# Patient Record
Sex: Female | Born: 2000 | Hispanic: No | Marital: Single | Smoking: Never smoker
Health system: Southern US, Community
[De-identification: ages and names within clinical notes are randomized; demographics above are authoritative.]

---
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
## 2020-02-16 DIAGNOSIS — R5383 Other fatigue: Secondary | ICD-10-CM | POA: Diagnosis not present

## 2020-02-16 DIAGNOSIS — R0602 Shortness of breath: Secondary | ICD-10-CM | POA: Diagnosis not present

## 2020-02-16 DIAGNOSIS — Z13220 Encounter for screening for lipoid disorders: Secondary | ICD-10-CM | POA: Diagnosis not present

## 2020-02-16 DIAGNOSIS — Z1159 Encounter for screening for other viral diseases: Secondary | ICD-10-CM | POA: Diagnosis not present

## 2020-02-16 DIAGNOSIS — Z131 Encounter for screening for diabetes mellitus: Secondary | ICD-10-CM | POA: Diagnosis not present

## 2020-02-16 DIAGNOSIS — E559 Vitamin D deficiency, unspecified: Secondary | ICD-10-CM | POA: Diagnosis not present

## 2020-02-16 DIAGNOSIS — Z114 Encounter for screening for human immunodeficiency virus [HIV]: Secondary | ICD-10-CM | POA: Diagnosis not present

## 2020-02-16 DIAGNOSIS — Z0000 Encounter for general adult medical examination without abnormal findings: Secondary | ICD-10-CM | POA: Diagnosis not present

## 2020-02-24 DIAGNOSIS — Z9229 Personal history of other drug therapy: Secondary | ICD-10-CM | POA: Diagnosis not present

## 2020-02-24 DIAGNOSIS — E559 Vitamin D deficiency, unspecified: Secondary | ICD-10-CM | POA: Diagnosis not present

## 2020-03-02 DIAGNOSIS — Z23 Encounter for immunization: Secondary | ICD-10-CM | POA: Diagnosis not present

## 2020-04-28 ENCOUNTER — Ambulatory Visit (HOSPITAL_COMMUNITY)
Admission: EM | Admit: 2020-04-28 | Discharge: 2020-04-28 | Disposition: A | Discharge status: Home / Self Care | Payer: PRIVATE HEALTH INSURANCE | Attending: Family Medicine | Admitting: Family Medicine

## 2020-04-28 ENCOUNTER — Other Ambulatory Visit: Payer: Self-pay

## 2020-04-28 ENCOUNTER — Encounter (HOSPITAL_COMMUNITY): Payer: Self-pay

## 2020-04-28 DIAGNOSIS — J069 Acute upper respiratory infection, unspecified: Secondary | ICD-10-CM | POA: Insufficient documentation

## 2020-04-28 DIAGNOSIS — Z20822 Contact with and (suspected) exposure to covid-19: Secondary | ICD-10-CM | POA: Insufficient documentation

## 2020-04-28 LAB — SARS CORONAVIRUS 2 (TAT 6-24 HRS): SARS Coronavirus 2: NEGATIVE

## 2020-04-28 MED ORDER — PSEUDOEPH-BROMPHEN-DM 30-2-10 MG/5ML PO SYRP: 5.0000 mL | ORAL_SOLUTION | Freq: Four times a day (QID) | ORAL | 0 refills | Status: DC | PRN

## 2020-04-28 NOTE — Discharge Instructions (Addendum)
You have been tested for COVID-19 today. If your test returns positive, you will receive a phone call from [facility] regarding your results. Negative test results are not called. Both positive and negative results area always visible on MyChart. If you do not have a MyChart account, sign up instructions are provided in your discharge papers. Please do not hesitate to contact us should you have questions or concerns.  You may use over the counter ibuprofen or acetaminophen as needed.  For a sore throat, over the counter products such as [employer] Peroxyl Mouth Sore Rinse or Chloraseptic Sore Throat Spray may provide some temporary relief.   

## 2020-04-28 NOTE — ED Triage Notes (Signed)
Pt c/o sore throat, congestion, non-productive coughbody aches onset Tuesday, bilateral ear pressure onset yesterday. Nausea onset today upon waking, now resolved.  Denies v/d, fever,SOB, chills, loss of taste/smell. Last took ibuprofen yesterday.

## 2020-04-28 NOTE — ED Provider Notes (Signed)
[facility]   [id number] 04/28/20 Arrival Time: 1210  ASSESSMENT & PLAN:  1. Viral URI with cough     COVID-19 testing sent. See letter/work note on file for self-isolation guidelines. OTC symptom care as needed.   Meds ordered this encounter  Medications  . brompheniramine-pseudoephedrine-DM 30-2-10 MG/5ML syrup    Sig: Take 5 mLs by mouth 4 (four) times daily as needed.    Dispense:  120 mL    Refill:  0     Follow-up Information    Knox Royalty, MD.   Specialty: Family Medicine Why: As needed. Contact information: [address] [place] [address] [phone]               Reviewed expectations re: course of current medical issues. Questions answered. Outlined signs and symptoms indicating need for more acute intervention. Understanding verbalized. After Visit Summary given.   SUBJECTIVE: History from: patient. Diana Mcclure is a 19 y.o. female who presents with worries regarding COVID-19. Known COVID-19 contact: none. Recent travel: none. Reports: ST, non-prod cough, body aches, otalgia for the past few days. Denies: fever and difficulty breathing. Normal PO intake without n/v/d.   OBJECTIVE:  Vitals:   04/28/20 1403  BP: 111/74  Pulse: 79  Resp: 18  Temp: 98.1 F (36.7 C)  TempSrc: Oral  SpO2: 100%    General appearance: alert; no distress Eyes: PERRLA; EOMI; conjunctiva normal HENT: [place]; AT; with nasal congestion; throat with cobblestoning Neck: supple  Lungs: speaks full sentences without difficulty; unlabored Extremities: no edema Skin: warm and dry Neurologic: normal gait Psychological: alert and cooperative; normal mood and affect  Labs:  Labs Reviewed  SARS CORONAVIRUS 2 (TAT 6-24 HRS)     No Known Allergies  History reviewed. No pertinent past medical history. Social History   Socioeconomic History  . Marital status: Single    Spouse name: Not on file  . Number of children: Not on file  . Years of  education: Not on file  . Highest education level: Not on file  Occupational History  . Not on file  Tobacco Use  . Smoking status: Never Smoker  . Smokeless tobacco: Never Used  Vaping Use  . Vaping Use: Never used  Substance and Sexual Activity  . Alcohol use: Never    Alcohol/week: 0.0 standard drinks  . Drug use: Never  . Sexual activity: Never  Other Topics Concern  . Not on file  Social History Narrative  . Not on file   Social Determinants of Health   Financial Resource Strain:   . Difficulty of Paying Living Expenses: Not on file  Food Insecurity:   . Worried About Programme researcher, broadcasting/film/video in the Last Year: Not on file  . Ran Out of Food in the Last Year: Not on file  Transportation Needs:   . Lack of Transportation (Medical): Not on file  . Lack of Transportation (Non-Medical): Not on file  Physical Activity:   . Days of Exercise per Week: Not on file  . Minutes of Exercise per Session: Not on file  Stress:   . Feeling of Stress : Not on file  Social Connections:   . Frequency of Communication with Friends and Family: Not on file  . Frequency of Social Gatherings with Friends and Family: Not on file  . Attends Religious Services: Not on file  . Active Member of Clubs or Organizations: Not on file  . Attends Banker Meetings: Not on file  . Marital  Status: Not on file  Intimate Partner Violence:   . Fear of Current or Ex-Partner: Not on file  . Emotionally Abused: Not on file  . Physically Abused: Not on file  . Sexually Abused: Not on file   Family History  Problem Relation Age of Onset  . Diabetes Father    History reviewed. No pertinent surgical history.   Mardella Layman, MD 04/28/20 [phone]

## 2020-05-07 ENCOUNTER — Encounter (HOSPITAL_COMMUNITY): Payer: Self-pay

## 2020-05-07 ENCOUNTER — Other Ambulatory Visit: Payer: Self-pay

## 2020-05-07 ENCOUNTER — Ambulatory Visit (HOSPITAL_COMMUNITY)
Admission: EM | Admit: 2020-05-07 | Discharge: 2020-05-07 | Disposition: A | Discharge status: Home / Self Care | Payer: PRIVATE HEALTH INSURANCE | Attending: Family Medicine | Admitting: Family Medicine

## 2020-05-07 DIAGNOSIS — Z79899 Other long term (current) drug therapy: Secondary | ICD-10-CM | POA: Insufficient documentation

## 2020-05-07 DIAGNOSIS — R5383 Other fatigue: Secondary | ICD-10-CM | POA: Insufficient documentation

## 2020-05-07 DIAGNOSIS — R062 Wheezing: Secondary | ICD-10-CM | POA: Diagnosis not present

## 2020-05-07 DIAGNOSIS — Z7952 Long term (current) use of systemic steroids: Secondary | ICD-10-CM | POA: Diagnosis not present

## 2020-05-07 DIAGNOSIS — J40 Bronchitis, not specified as acute or chronic: Secondary | ICD-10-CM | POA: Insufficient documentation

## 2020-05-07 DIAGNOSIS — Z20822 Contact with and (suspected) exposure to covid-19: Secondary | ICD-10-CM | POA: Diagnosis not present

## 2020-05-07 LAB — SARS CORONAVIRUS 2 (TAT 6-24 HRS): SARS Coronavirus 2: NEGATIVE

## 2020-05-07 MED ORDER — ALBUTEROL SULFATE HFA 108 (90 BASE) MCG/ACT IN AERS: 1.0000 | INHALATION_SPRAY | Freq: Four times a day (QID) | RESPIRATORY_TRACT | 0 refills | Status: DC | PRN

## 2020-05-07 MED ORDER — PREDNISONE 20 MG PO TABS: 40.0000 mg | ORAL_TABLET | Freq: Every day | ORAL | 0 refills | Status: DC

## 2020-05-07 NOTE — ED Provider Notes (Signed)
[facility]    CSN: [id number] Arrival date & time: 05/07/20  1007      History   Chief Complaint Chief Complaint  Patient presents with  . Cough    HPI Diana Mcclure is a 19 y.o. female.   Here today for re-evaluation of worsening URI sxs. Was tested last week right after onset of sxs for COVID and was negative, has been taking sudafed and dayquil and nyquil without much relief. Fatigue, chest tightness and occasional wheezing, hacking dry cough, sinus and ear pressure. Denies fever, chills, CP, N/V/D, rashes, new sick contacts. No known hx of asthma or allergies.       History reviewed. No pertinent past medical history.  There are no problems to display for this patient.   History reviewed. No pertinent surgical history.  OB History   No obstetric history on file.      Home Medications    Prior to Admission medications   Medication Sig Start Date End Date Taking? Authorizing Provider  brompheniramine-pseudoephedrine-DM 30-2-10 MG/5ML syrup Take 5 mLs by mouth 4 (four) times daily as needed. 04/28/20  Yes Hagler, Arlys John, MD  albuterol (VENTOLIN HFA) 108 (90 Base) MCG/ACT inhaler Inhale 1-2 puffs into the lungs every 6 (six) hours as needed for wheezing or shortness of breath. 05/07/20   Particia Nearing, PA-C  mupirocin ointment (BACTROBAN) 2 % Apply 1 application topically 2 (two) times daily. 09/14/18   Mardella Layman, MD  predniSONE (DELTASONE) 20 MG tablet Take 2 tablets (40 mg total) by mouth daily with breakfast. 05/07/20   Particia Nearing, PA-C  Vitamin D, Ergocalciferol, (DRISDOL) 1.25 MG (50000 UNIT) CAPS capsule Take 50,000 Units by mouth once a week. 02/24/20   [provider]    Family History Family History  Problem Relation Age of Onset  . Diabetes Father     Social History Social History   Tobacco Use  . Smoking status: Never Smoker  . Smokeless tobacco: Never Used  Vaping Use  . Vaping Use: Never used    Substance Use Topics  . Alcohol use: Never    Alcohol/week: 0.0 standard drinks  . Drug use: Never     Allergies   Patient has no known allergies.   Review of Systems Review of Systems PER HPI   Physical Exam Triage Vital Signs ED Triage Vitals  Enc Vitals Group     BP 05/07/20 1049 115/76     Pulse Rate 05/07/20 1049 93     Resp 05/07/20 1049 15     Temp 05/07/20 1049 98.7 F (37.1 C)     Temp Source 05/07/20 1049 Oral     SpO2 05/07/20 1049 100 %     Weight --      Height --      Head Circumference --      Peak Flow --      Pain Score 05/07/20 1045 5     Pain Loc --      Pain Edu? --      Excl. in GC? --    No data found.  Updated Vital Signs BP 115/76 (BP Location: Right Arm)   Pulse 93   Temp 98.7 F (37.1 C) (Oral)   Resp 15   LMP 04/07/2020   SpO2 100%   Visual Acuity Right Eye Distance:   Left Eye Distance:   Bilateral Distance:    Right Eye Near:   Left Eye Near:    Bilateral Near:  Physical Exam Vitals and nursing note reviewed.  Constitutional:      Appearance: Normal appearance. She is not ill-appearing.  HENT:     Head: Atraumatic.     Right Ear: Tympanic membrane normal.     Left Ear: Tympanic membrane normal.     Nose: Rhinorrhea present.     Mouth/Throat:     Mouth: Mucous membranes are moist.     Pharynx: Posterior oropharyngeal erythema present.  Eyes:     Extraocular Movements: Extraocular movements intact.     Conjunctiva/sclera: Conjunctivae normal.  Cardiovascular:     Rate and Rhythm: Normal rate and regular rhythm.     Heart sounds: Normal heart sounds.  Pulmonary:     Effort: Pulmonary effort is normal.     Breath sounds: Normal breath sounds. No wheezing or rales.  Abdominal:     General: Bowel sounds are normal. There is no distension.     Palpations: Abdomen is soft.     Tenderness: There is no abdominal tenderness. There is no guarding.  Musculoskeletal:        General: Normal range of motion.      Cervical back: Normal range of motion and neck supple.  Skin:    General: Skin is warm and dry.  Neurological:     Mental Status: She is alert and oriented to person, place, and time.  Psychiatric:        Mood and Affect: Mood normal.        Thought Content: Thought content normal.        Judgment: Judgment normal.    UC Treatments / Results  Labs (all labs ordered are listed, but only abnormal results are displayed) Labs Reviewed  SARS CORONAVIRUS 2 (TAT 6-24 HRS)    EKG   Radiology No results found.  Procedures Procedures (including critical care time)  Medications Ordered in UC Medications - No data to display  Initial Impression / Assessment and Plan / UC Course  I have reviewed the triage vital signs and the nursing notes.  Pertinent labs & imaging results that were available during my care of the patient were reviewed by me and considered in my medical decision making (see chart for details).     Retest for COVID pending, isolation protocol reviewed and work note given. Vitals and exam reassuring today, she is well appearing overall. Will treat today with prednisone, albuterol, and continued dayquil and nyquil prn. Return precautions reviewed for worsening sxs.   Final Clinical Impressions(s) / UC Diagnoses   Final diagnoses:  Bronchitis   Discharge Instructions   None    ED Prescriptions    Medication Sig Dispense Auth. Provider   predniSONE (DELTASONE) 20 MG tablet Take 2 tablets (40 mg total) by mouth daily with breakfast. 10 tablet Particia Nearing, PA-C   albuterol (VENTOLIN HFA) 108 (90 Base) MCG/ACT inhaler Inhale 1-2 puffs into the lungs every 6 (six) hours as needed for wheezing or shortness of breath. 18 g Particia Nearing, [place]     PDMP not reviewed this encounter.   Particia Nearing, [place] 05/07/20 1115

## 2020-05-07 NOTE — ED Triage Notes (Signed)
Pt reports she was evaluated here last week for sore throat and congestion and had negative COVID testing.  Today reports non-productive cough, worsening congestion, pressure behind ears, body aches, increased fatigue for approx 5 days. Reports sore throat improved.  Employer ([employer]) sent pt for eval and re-testing of COVID.   Denies SOB, fever, n/v/d.  Has been taking nyquil, dayquil for symptoms, last took OTC yesterday w/o improvement to symptoms.

## 2020-08-03 ENCOUNTER — Ambulatory Visit: Payer: Medicaid Other | Attending: Internal Medicine

## 2020-08-03 DIAGNOSIS — Z23 Encounter for immunization: Secondary | ICD-10-CM

## 2020-08-03 NOTE — Progress Notes (Signed)
   Covid-19 Vaccination Clinic  Name:  Diana Mcclure    MRN: [id number] DOB: 05-Dec-2000  08/03/2020  Diana Mcclure was observed post Covid-19 immunization for 15 minutes without incident. She was provided with Vaccine Information Sheet and instruction to access the V-Safe system.   Diana Mcclure was instructed to call 911 with any severe reactions post vaccine: Marland Kitchen Difficulty breathing  . Swelling of face and throat  . A fast heartbeat  . A bad rash all over body  . Dizziness and weakness   Immunizations Administered    Name Date Dose VIS Date Route   Pfizer COVID-19 Vaccine 08/03/2020  1:58 PM 0.3 mL 05/17/2020 Intramuscular   Manufacturer: [employer], [employer]   Lot: [id number]   NDC: [address]-9

## 2020-12-07 ENCOUNTER — Other Ambulatory Visit: Payer: Self-pay

## 2020-12-07 ENCOUNTER — Ambulatory Visit (HOSPITAL_COMMUNITY)
Admission: EM | Admit: 2020-12-07 | Discharge: 2020-12-07 | Disposition: A | Discharge status: Home / Self Care | Payer: Medicaid Other

## 2020-12-07 ENCOUNTER — Encounter (HOSPITAL_COMMUNITY): Payer: Self-pay | Admitting: Emergency Medicine

## 2020-12-07 DIAGNOSIS — H65193 Other acute nonsuppurative otitis media, bilateral: Secondary | ICD-10-CM

## 2020-12-07 DIAGNOSIS — J069 Acute upper respiratory infection, unspecified: Secondary | ICD-10-CM

## 2020-12-07 MED ORDER — BENZONATATE 100 MG PO CAPS: 100.0000 mg | ORAL_CAPSULE | Freq: Three times a day (TID) | ORAL | 0 refills | Status: DC

## 2020-12-07 MED ORDER — PROMETHAZINE-DM 6.25-15 MG/5ML PO SYRP: 5.0000 mL | ORAL_SOLUTION | Freq: Four times a day (QID) | ORAL | 0 refills | Status: DC | PRN

## 2020-12-07 MED ORDER — PREDNISONE 20 MG PO TABS: 20.0000 mg | ORAL_TABLET | Freq: Every day | ORAL | 0 refills | Status: AC

## 2020-12-07 NOTE — ED Provider Notes (Signed)
[facility]    CSN: [id number] Arrival date & time: 12/07/20  1231      History   Chief Complaint Chief Complaint  Patient presents with  . Sore Throat    HPI Diana Mcclure is a 20 y.o. female presenting for sore throat, nasal congestion, subjective chills, body aches, throbbing headaches behind forehead.  She does note sick contacts at home, but no COVID that she knows of.  States she took a home COVID test and this was negative.  Did not take her temperature at home.  Took NyQuil and DayQuil yesterday, but has not taken any medications or antipyretic today. Ear pressure bilaterally, denies hearing changes, discharge from ears, tinnitus.  Denies n/v/d, shortness of breath, chest pain,  facial pain, teeth pain, loss of taste/smell, swollen lymph nodes, ear pain, trouble swallowing, worse headache of life, weakness.   HPI  History reviewed. No pertinent past medical history.  There are no problems to display for this patient.   History reviewed. No pertinent surgical history.  OB History   No obstetric history on file.      Home Medications    Prior to Admission medications   Medication Sig Start Date End Date Taking? Authorizing Provider  benzonatate (TESSALON) 100 MG capsule Take 1 capsule (100 mg total) by mouth every 8 (eight) hours. 12/07/20  Yes Rhys Martini, PA-C  predniSONE (DELTASONE) 20 MG tablet Take 1 tablet (20 mg total) by mouth daily for 5 days. 12/07/20 12/12/20 Yes Rhys Martini, PA-C  promethazine-dextromethorphan (PROMETHAZINE-DM) 6.25-15 MG/5ML syrup Take 5 mLs by mouth 4 (four) times daily as needed for cough. 12/07/20  Yes Rhys Martini, PA-C  albuterol (VENTOLIN HFA) 108 (90 Base) MCG/ACT inhaler Inhale 1-2 puffs into the lungs every 6 (six) hours as needed for wheezing or shortness of breath. Patient not taking: Reported on 12/07/2020 05/07/20 12/07/20  Particia Nearing, PA-C    Family History Family History  Problem Relation  Age of Onset  . Diabetes Father     Social History Social History   Tobacco Use  . Smoking status: Never Smoker  . Smokeless tobacco: Never Used  Vaping Use  . Vaping Use: Never used  Substance Use Topics  . Alcohol use: Never    Alcohol/week: 0.0 standard drinks  . Drug use: Never     Allergies   Patient has no known allergies.   Review of Systems Review of Systems  Constitutional: Positive for chills. Negative for appetite change and fever.  HENT: Positive for congestion, ear pain and sore throat. Negative for rhinorrhea, sinus pressure, sinus pain, tinnitus, trouble swallowing and voice change.   Eyes: Negative for redness and visual disturbance.  Respiratory: Negative for cough, chest tightness, shortness of breath and wheezing.   Cardiovascular: Negative for chest pain and palpitations.  Gastrointestinal: Negative for abdominal pain, constipation, diarrhea, nausea and vomiting.  Genitourinary: Negative for dysuria, frequency and urgency.  Musculoskeletal: Negative for myalgias.  Neurological: Negative for dizziness, weakness and headaches.  Psychiatric/Behavioral: Negative for confusion.  All other systems reviewed and are negative.    Physical Exam Triage Vital Signs ED Triage Vitals  Enc Vitals Group     BP      Pulse      Resp      Temp      Temp src      SpO2      Weight      Height      Head Circumference  Peak Flow      Pain Score      Pain Loc      Pain Edu?      Excl. in GC?    No data found.  Updated Vital Signs BP 112/80 (BP Location: Right Arm)   Pulse (!) 104   Temp 98.9 F (37.2 C) (Oral)   Resp 18   LMP 11/11/2020   SpO2 100%   Visual Acuity Right Eye Distance:   Left Eye Distance:   Bilateral Distance:    Right Eye Near:   Left Eye Near:    Bilateral Near:     Physical Exam Vitals reviewed.  Constitutional:      General: She is not in acute distress.    Appearance: Normal appearance. She is not ill-appearing.   HENT:     Head: Normocephalic and atraumatic.     Right Ear: Hearing, ear canal and external ear normal. No swelling or tenderness. A middle ear effusion is present. There is no impacted cerumen. No mastoid tenderness. Tympanic membrane is not perforated, erythematous, retracted or bulging.     Left Ear: Hearing, ear canal and external ear normal. No swelling or tenderness. A middle ear effusion is present. There is no impacted cerumen. No mastoid tenderness. Tympanic membrane is not perforated, erythematous, retracted or bulging.     Nose:     Right Sinus: No maxillary sinus tenderness or frontal sinus tenderness.     Left Sinus: No maxillary sinus tenderness or frontal sinus tenderness.     Mouth/Throat:     Mouth: Mucous membranes are moist.     Pharynx: Uvula midline. No oropharyngeal exudate or posterior oropharyngeal erythema.     Tonsils: No tonsillar exudate. 0 on the right. 0 on the left.     Comments: Smooth erythema posterior pharynx Cardiovascular:     Rate and Rhythm: Normal rate and regular rhythm.     Heart sounds: Normal heart sounds.  Pulmonary:     Breath sounds: Normal breath sounds and air entry. No wheezing, rhonchi or rales.  Chest:     Chest wall: No tenderness.  Abdominal:     General: Abdomen is flat. Bowel sounds are normal.     Tenderness: There is no abdominal tenderness. There is no guarding or rebound.  Lymphadenopathy:     Cervical: No cervical adenopathy.  Skin:    Capillary Refill: Capillary refill takes less than 2 seconds.  Neurological:     General: No focal deficit present.     Mental Status: She is alert and oriented to person, place, and time.  Psychiatric:        Attention and Perception: Attention and perception normal.        Mood and Affect: Mood and affect normal.        Behavior: Behavior normal. Behavior is cooperative.        Thought Content: Thought content normal.        Judgment: Judgment normal.      UC Treatments / Results   Labs (all labs ordered are listed, but only abnormal results are displayed) Labs Reviewed - No data to display  EKG   Radiology No results found.  Procedures Procedures (including critical care time)  Medications Ordered in UC Medications - No data to display  Initial Impression / Assessment and Plan / UC Course  I have reviewed the triage vital signs and the nursing notes.  Pertinent labs & imaging results that were available during my care  of the patient were reviewed by me and considered in my medical decision making (see chart for details).     This patient is a 20 year old female presenting with viral URI with cough.  Borderline tachycardic but afebrile, nontachypneic, oxygenating well on room air without wheezes rhonchi or rales.  No history of cardiopulmonary disease.  Centor score 0, rapid strep deferred. Negative home COVID test.  Declines COVID PCR today.  For bilateral middle ear effusion, low-dose of prednisone as below.  Also sent promethazine and Tessalon.  Tylenol/ibuprofen, rest, good hydration.  ED return precautions discussed.  Final Clinical Impressions(s) / UC Diagnoses   Final diagnoses:  Viral URI with cough  Acute effusion of both middle ears     Discharge Instructions     -Prednisone, 1 pill for 5 days in a row.  Try taking this earlier in the day as it can give you energy. This will help with the ear pressure.  -For fevers/chills, body aches, headaches- Take Tylenol 1000 mg 3 times daily, and ibuprofen 800 mg 3 times daily with food.  You can take these together, or alternate every 3-4 hours. -You do not have a sinus infection today.  If your nasal symptoms like facial pressure, congestion persist for at least 7 days, this is when we start thinking you might have a sinus infection.  Sinus infections commonly occur after you get better from a cold, then you develop facial pressure, nasal congestion, new fever/chills.     ED Prescriptions     Medication Sig Dispense Auth. Provider   promethazine-dextromethorphan (PROMETHAZINE-DM) 6.25-15 MG/5ML syrup Take 5 mLs by mouth 4 (four) times daily as needed for cough. 118 mL Rhys Martini, PA-C   benzonatate (TESSALON) 100 MG capsule Take 1 capsule (100 mg total) by mouth every 8 (eight) hours. 21 capsule Rhys Martini, PA-C   predniSONE (DELTASONE) 20 MG tablet Take 1 tablet (20 mg total) by mouth daily for 5 days. 5 tablet Rhys Martini, PA-C     PDMP not reviewed this encounter.   Rhys Martini, PA-C 12/07/20 1415

## 2020-12-07 NOTE — ED Triage Notes (Signed)
Sore throat started on Tuesday.  Took dayquil, nyquil and alka seltzer.  Today woke feeling worse with body aches and head pressure

## 2020-12-07 NOTE — Discharge Instructions (Addendum)
-  Prednisone, 1 pill for 5 days in a row.  Try taking this earlier in the day as it can give you energy. This will help with the ear pressure.  -For fevers/chills, body aches, headaches- Take Tylenol 1000 mg 3 times daily, and ibuprofen 800 mg 3 times daily with food.  You can take these together, or alternate every 3-4 hours. -You do not have a sinus infection today.  If your nasal symptoms like facial pressure, congestion persist for at least 7 days, this is when we start thinking you might have a sinus infection.  Sinus infections commonly occur after you get better from a cold, then you develop facial pressure, nasal congestion, new fever/chills.

## 2020-12-25 ENCOUNTER — Encounter (HOSPITAL_BASED_OUTPATIENT_CLINIC_OR_DEPARTMENT_OTHER): Payer: Self-pay | Admitting: *Deleted

## 2020-12-25 ENCOUNTER — Emergency Department (HOSPITAL_BASED_OUTPATIENT_CLINIC_OR_DEPARTMENT_OTHER)
Admission: EM | Admit: 2020-12-25 | Discharge: 2020-12-25 | Disposition: A | Discharge status: Home / Self Care | Payer: Medicaid Other | Attending: Emergency Medicine | Admitting: Emergency Medicine

## 2020-12-25 ENCOUNTER — Other Ambulatory Visit: Payer: Self-pay

## 2020-12-25 DIAGNOSIS — R1110 Vomiting, unspecified: Secondary | ICD-10-CM | POA: Diagnosis present

## 2020-12-25 DIAGNOSIS — K529 Noninfective gastroenteritis and colitis, unspecified: Secondary | ICD-10-CM | POA: Diagnosis not present

## 2020-12-25 LAB — CBC WITH DIFFERENTIAL/PLATELET
Abs Immature Granulocytes: 0.03 10*3/uL (ref 0.00–0.07)
Basophils Absolute: 0 10*3/uL (ref 0.0–0.1)
Basophils Relative: 0 %
Eosinophils Absolute: 0 10*3/uL (ref 0.0–0.5)
Eosinophils Relative: 0 %
HCT: 43.5 % (ref 36.0–46.0)
Hemoglobin: 14.1 g/dL (ref 12.0–15.0)
Immature Granulocytes: 0 %
Lymphocytes Relative: 10 %
Lymphs Abs: 0.8 10*3/uL (ref 0.7–4.0)
MCH: 27.3 pg (ref 26.0–34.0)
MCHC: 32.4 g/dL (ref 30.0–36.0)
MCV: 84.1 fL (ref 80.0–100.0)
Monocytes Absolute: 0.5 10*3/uL (ref 0.1–1.0)
Monocytes Relative: 6 %
Neutro Abs: 7 10*3/uL (ref 1.7–7.7)
Neutrophils Relative %: 84 %
Platelets: 345 10*3/uL (ref 150–400)
RBC: 5.17 MIL/uL — ABNORMAL HIGH (ref 3.87–5.11)
RDW: 15.7 % — ABNORMAL HIGH (ref 11.5–15.5)
WBC: 8.3 10*3/uL (ref 4.0–10.5)
nRBC: 0 % (ref 0.0–0.2)

## 2020-12-25 LAB — COMPREHENSIVE METABOLIC PANEL
ALT: 14 U/L (ref 0–44)
AST: 16 U/L (ref 15–41)
Albumin: 4.7 g/dL (ref 3.5–5.0)
Alkaline Phosphatase: 72 U/L (ref 38–126)
Anion gap: 12 (ref 5–15)
BUN: 11 mg/dL (ref 6–20)
CO2: 23 mmol/L (ref 22–32)
Calcium: 9.1 mg/dL (ref 8.9–10.3)
Chloride: 103 mmol/L (ref 98–111)
Creatinine, Ser: 0.64 mg/dL (ref 0.44–1.00)
GFR, Estimated: >60 mL/min (ref >60)
Glucose, Bld: 97 mg/dL (ref 70–99)
Potassium: 3.3 mmol/L — ABNORMAL LOW (ref 3.5–5.1)
Sodium: 138 mmol/L (ref 135–145)
Total Bilirubin: 0.4 mg/dL (ref 0.3–1.2)
Total Protein: 7.5 g/dL (ref 6.5–8.1)

## 2020-12-25 MED ORDER — ONDANSETRON 4 MG PO TBDP: 4.0000 mg | ORAL_TABLET | Freq: Three times a day (TID) | ORAL | 0 refills | Status: DC | PRN

## 2020-12-25 MED ORDER — ONDANSETRON HCL 4 MG/2ML IJ SOLN
4.0000 mg | Freq: Once | INTRAMUSCULAR | Status: AC
  Administered 2020-12-25: 4 mg via INTRAVENOUS
  Filled 2020-12-25: qty 2

## 2020-12-25 MED ORDER — SODIUM CHLORIDE 0.9 % IV BOLUS
1000.0000 mL | Freq: Once | INTRAVENOUS | Status: AC
  Administered 2020-12-25: 1000 mL via INTRAVENOUS

## 2020-12-25 NOTE — ED Notes (Signed)
Pt sipping on ice water, denies any nausea at this time.

## 2020-12-25 NOTE — ED Triage Notes (Signed)
Yesterday evening, approx 2300hrs began feeling nauseated, approx 2 hours later began having a vomiting episode x 3, then experienced diarrhea followed by abd cramps. Has been attempting to rehydrate, but every time she drinks she immediately has a vomiting episodes.

## 2020-12-25 NOTE — ED Provider Notes (Signed)
[facility] EMERGENCY DEPT Provider Note   CSN: [id number] Arrival date & time: 12/25/20  0973     History Chief Complaint  Patient presents with  . Nausea    Diana Mcclure is a 20 y.o. female.  The history is provided by the patient.  Emesis Severity:  Severe Duration:  1 day Timing:  Intermittent Emesis appearance: non-bloody. Progression:  Unchanged Chronicity:  New Relieved by:  Nothing Worsened by:  Liquids Associated symptoms: abdominal pain (cramping) and diarrhea   Associated symptoms: no arthralgias, no chills, no cough, no fever, no headaches, no myalgias and no sore throat   Risk factors: no suspect food intake   Risk factors comment:  Went to [facility] yesterday. Nobody else who traveled with her got sick.      No past medical history on file.  There are no problems to display for this patient.   No past surgical history on file.   OB History   No obstetric history on file.     Family History  Problem Relation Age of Onset  . Diabetes Father     Social History   Tobacco Use  . Smoking status: Never Smoker  . Smokeless tobacco: Never Used  Vaping Use  . Vaping Use: Never used  Substance Use Topics  . Alcohol use: Never    Alcohol/week: 0.0 standard drinks  . Drug use: Never    Home Medications Prior to Admission medications   Medication Sig Start Date End Date Taking? Authorizing Provider  benzonatate (TESSALON) 100 MG capsule Take 1 capsule (100 mg total) by mouth every 8 (eight) hours. 12/07/20   Rhys Martini, PA-C  promethazine-dextromethorphan (PROMETHAZINE-DM) 6.25-15 MG/5ML syrup Take 5 mLs by mouth 4 (four) times daily as needed for cough. 12/07/20   Rhys Martini, PA-C  albuterol (VENTOLIN HFA) 108 (90 Base) MCG/ACT inhaler Inhale 1-2 puffs into the lungs every 6 (six) hours as needed for wheezing or shortness of breath. Patient not taking: Reported on 12/07/2020 05/07/20 12/07/20  Particia Nearing, PA-C     Allergies    Patient has no known allergies.  Review of Systems   Review of Systems  Constitutional: Negative for chills and fever.  HENT: Negative for ear pain and sore throat.   Eyes: Negative for pain and visual disturbance.  Respiratory: Negative for cough and shortness of breath.   Cardiovascular: Negative for chest pain and palpitations.  Gastrointestinal: Positive for abdominal pain (cramping), diarrhea and vomiting.  Genitourinary: Negative for dysuria and hematuria.  Musculoskeletal: Negative for arthralgias, back pain and myalgias.  Skin: Negative for color change and rash.  Neurological: Negative for seizures, syncope and headaches.  All other systems reviewed and are negative.   Physical Exam Updated Vital Signs BP 123/77 (BP Location: Right Arm)   Pulse (!) 121   Temp 99.3 F (37.4 C) (Oral)   Resp 18   Ht 5\' 2"  (1.575 m)   Wt 79.4 kg   SpO2 98%   BMI 32.01 kg/m   Physical Exam Vitals and nursing note reviewed.  HENT:     Head: Normocephalic and atraumatic.  Eyes:     General: No scleral icterus. Pulmonary:     Effort: Pulmonary effort is normal. No respiratory distress.  Abdominal:     General: There is no distension.     Tenderness: There is no abdominal tenderness. There is no guarding.  Musculoskeletal:     Cervical back: Normal range of motion.  Skin:  General: Skin is warm and dry.  Neurological:     General: No focal deficit present.     Mental Status: She is alert and oriented to person, place, and time.  Psychiatric:        Mood and Affect: Mood normal.     ED Results / Procedures / Treatments   Labs (all labs ordered are listed, but only abnormal results are displayed) Labs Reviewed  CBC WITH DIFFERENTIAL/PLATELET - Abnormal; Notable for the following components:      Result Value   RBC 5.17 (*)    RDW 15.7 (*)    All other components within normal limits  COMPREHENSIVE METABOLIC PANEL - Abnormal; Notable for the following  components:   Potassium 3.3 (*)    All other components within normal limits    EKG None  Radiology No results found.  Procedures Procedures   Medications Ordered in ED Medications  sodium chloride 0.9 % bolus 1,000 mL (0 mLs Intravenous Stopped 12/25/20 1945)  ondansetron (ZOFRAN) injection 4 mg (4 mg Intravenous Given 12/25/20 1852)    ED Course  I have reviewed the triage vital signs and the nursing notes.  Pertinent labs & imaging results that were available during my care of the patient were reviewed by me and considered in my medical decision making (see chart for details).    MDM Rules/Calculators/A&P                          Analyssa K Mcclure presents with vomiting and diarrhea.  She was noted to be tachycardic.  She was given IV hydration.  Labs returned revealing very mild hypokalemia but otherwise reassuring.  She was feeling better at the time of discharge and was tolerating water.  Based on my abdominal exam, I do not think intra-abdominal pathology is present.  Imaging was deferred as a result.  Careful return precautions were given. Final Clinical Impression(s) / ED Diagnoses Final diagnoses:  Gastroenteritis    Rx / DC Orders ED Discharge Orders         Ordered    ondansetron (ZOFRAN ODT) 4 MG disintegrating tablet  Every 8 hours PRN        12/25/20 2001           Koleen Distance, MD 12/25/20 2003

## 2020-12-26 ENCOUNTER — Telehealth: Payer: Self-pay | Admitting: *Deleted

## 2020-12-26 NOTE — Telephone Encounter (Signed)
Transition Care Management Follow-up Telephone Call  Date of discharge and from where: 12/26/2020 - [facility] ED  How have you been since you were released from the hospital? "A little better"  Any questions or concerns? No  Items Reviewed:  Did the pt receive and understand the discharge instructions provided? Yes   Medications obtained and verified? Yes   Other? No   Any new allergies since your discharge? No   Dietary orders reviewed? No  Do you have support at home? Yes    Functional Questionnaire: (I = Independent and D = Dependent) ADLs: I  Bathing/Dressing- I  Meal Prep- I  Eating- I  Maintaining continence- I  Transferring/Ambulation- I  Managing Meds- I  Follow up appointments reviewed:   PCP Hospital f/u appt confirmed? No    Specialist Hospital f/u appt confirmed? No    Are transportation arrangements needed? No   If their condition worsens, is the pt aware to call PCP or go to the Emergency Dept.? Yes  Was the patient provided with contact information for the PCP's office or ED? Yes  Was to pt encouraged to call back with questions or concerns? Yes

## 2021-03-12 DIAGNOSIS — R5383 Other fatigue: Secondary | ICD-10-CM | POA: Diagnosis not present

## 2021-03-12 DIAGNOSIS — Z1339 Encounter for screening examination for other mental health and behavioral disorders: Secondary | ICD-10-CM | POA: Diagnosis not present

## 2021-03-12 DIAGNOSIS — R0602 Shortness of breath: Secondary | ICD-10-CM | POA: Diagnosis not present

## 2021-03-12 DIAGNOSIS — E559 Vitamin D deficiency, unspecified: Secondary | ICD-10-CM | POA: Diagnosis not present

## 2021-03-12 DIAGNOSIS — Z1159 Encounter for screening for other viral diseases: Secondary | ICD-10-CM | POA: Diagnosis not present

## 2021-03-12 DIAGNOSIS — Z0000 Encounter for general adult medical examination without abnormal findings: Secondary | ICD-10-CM | POA: Diagnosis not present

## 2021-03-15 DIAGNOSIS — Z0000 Encounter for general adult medical examination without abnormal findings: Secondary | ICD-10-CM | POA: Diagnosis not present

## 2021-03-15 DIAGNOSIS — R5383 Other fatigue: Secondary | ICD-10-CM | POA: Diagnosis not present

## 2021-03-15 DIAGNOSIS — Z1159 Encounter for screening for other viral diseases: Secondary | ICD-10-CM | POA: Diagnosis not present

## 2021-03-15 DIAGNOSIS — E559 Vitamin D deficiency, unspecified: Secondary | ICD-10-CM | POA: Diagnosis not present

## 2021-03-30 DIAGNOSIS — E559 Vitamin D deficiency, unspecified: Secondary | ICD-10-CM | POA: Diagnosis not present

## 2021-03-30 DIAGNOSIS — L700 Acne vulgaris: Secondary | ICD-10-CM | POA: Diagnosis not present

## 2021-04-06 DIAGNOSIS — L700 Acne vulgaris: Secondary | ICD-10-CM | POA: Diagnosis not present

## 2021-07-06 DIAGNOSIS — L700 Acne vulgaris: Secondary | ICD-10-CM | POA: Diagnosis not present

## 2021-07-16 DIAGNOSIS — E559 Vitamin D deficiency, unspecified: Secondary | ICD-10-CM | POA: Diagnosis not present

## 2021-07-16 DIAGNOSIS — L700 Acne vulgaris: Secondary | ICD-10-CM | POA: Diagnosis not present

## 2021-07-16 DIAGNOSIS — Z6833 Body mass index (BMI) 33.0-33.9, adult: Secondary | ICD-10-CM | POA: Diagnosis not present

## 2021-07-16 DIAGNOSIS — Z20822 Contact with and (suspected) exposure to covid-19: Secondary | ICD-10-CM | POA: Diagnosis not present

## 2021-12-18 ENCOUNTER — Emergency Department (HOSPITAL_BASED_OUTPATIENT_CLINIC_OR_DEPARTMENT_OTHER)
Admission: EM | Admit: 2021-12-18 | Discharge: 2021-12-18 | Disposition: A | Discharge status: Home / Self Care | Payer: Medicaid Other | Attending: Emergency Medicine | Admitting: Emergency Medicine

## 2021-12-18 ENCOUNTER — Emergency Department (HOSPITAL_BASED_OUTPATIENT_CLINIC_OR_DEPARTMENT_OTHER): Payer: Medicaid Other | Admitting: Radiology

## 2021-12-18 ENCOUNTER — Encounter (HOSPITAL_BASED_OUTPATIENT_CLINIC_OR_DEPARTMENT_OTHER): Payer: Self-pay | Admitting: Emergency Medicine

## 2021-12-18 ENCOUNTER — Other Ambulatory Visit: Payer: Self-pay

## 2021-12-18 DIAGNOSIS — S6992XA Unspecified injury of left wrist, hand and finger(s), initial encounter: Secondary | ICD-10-CM | POA: Diagnosis present

## 2021-12-18 DIAGNOSIS — Z23 Encounter for immunization: Secondary | ICD-10-CM | POA: Diagnosis not present

## 2021-12-18 DIAGNOSIS — S61052A Open bite of left thumb without damage to nail, initial encounter: Secondary | ICD-10-CM | POA: Insufficient documentation

## 2021-12-18 DIAGNOSIS — W5501XA Bitten by cat, initial encounter: Secondary | ICD-10-CM | POA: Insufficient documentation

## 2021-12-18 DIAGNOSIS — T148XXA Other injury of unspecified body region, initial encounter: Secondary | ICD-10-CM

## 2021-12-18 MED ORDER — TETANUS-DIPHTH-ACELL PERTUSSIS 5-2.5-18.5 LF-MCG/0.5 IM SUSY
0.5000 mL | PREFILLED_SYRINGE | Freq: Once | INTRAMUSCULAR | Status: AC
  Administered 2021-12-18: 0.5 mL via INTRAMUSCULAR
  Filled 2021-12-18: qty 0.5

## 2021-12-18 MED ORDER — AMOXICILLIN-POT CLAVULANATE 875-125 MG PO TABS: 1.0000 | ORAL_TABLET | Freq: Two times a day (BID) | ORAL | 0 refills | Status: AC

## 2021-12-18 MED ORDER — AMOXICILLIN-POT CLAVULANATE 875-125 MG PO TABS
1.0000 | ORAL_TABLET | Freq: Once | ORAL | Status: AC
  Administered 2021-12-18: 1 via ORAL
  Filled 2021-12-18: qty 1

## 2021-12-18 MED ORDER — IBUPROFEN 400 MG PO TABS
400.0000 mg | ORAL_TABLET | Freq: Once | ORAL | Status: AC
  Administered 2021-12-18: 400 mg via ORAL
  Filled 2021-12-18: qty 1

## 2021-12-18 NOTE — ED Provider Notes (Signed)
[place] EMERGENCY DEPT Provider Note   CSN: [id number] Arrival date & time: 12/18/21  0148     History  Chief Complaint  Patient presents with   Animal Bite    Diana Mcclure is a 21 y.o. female.   Animal Bite Contact animal:  Cat Location:  Finger Finger injury location:  L thumb Time since incident:  7 hours Pain details:    Quality:  Aching   Severity:  Moderate   Timing:  Constant   Progression:  Worsening Animal in possession: yes   Tetanus status:  Unknown Worsened by:  Activity Associated symptoms: no fever   Patient reports she was trying to get her family members cat away from a dog.  When she grabbed the cat, it bit her left thumb.  The cat has had his vaccinations, but apparently is too young to receive rabies.  The cat had otherwise been acting normally. The patient has cleaned out her thumb with soap and water.  However she is having increasing pain, swelling and  redness to the thumb.    Home Medications Prior to Admission medications   Medication Sig Start Date End Date Taking? Authorizing Provider  amoxicillin-clavulanate (AUGMENTIN) 875-125 MG tablet Take 1 tablet by mouth every 12 (twelve) hours. 12/18/21  Yes Ripley Fraise, MD  albuterol (VENTOLIN HFA) 108 (90 Base) MCG/ACT inhaler Inhale 1-2 puffs into the lungs every 6 (six) hours as needed for wheezing or shortness of breath. Patient not taking: Reported on 12/07/2020 05/07/20 12/07/20  Volney American, PA-C      Allergies    Patient has no known allergies.    Review of Systems   Review of Systems  Constitutional:  Negative for fever.  Skin:  Positive for wound.   Physical Exam Updated Vital Signs BP 112/72   Pulse 93   Temp 98.9 F (37.2 C)   Resp 18   Ht 1.575 m (5\' 2" )   Wt 79 kg   LMP 11/16/2021   SpO2 99%   BMI 31.85 kg/m  Physical Exam CONSTITUTIONAL: Well developed/well nourished HEAD AND FACE: Normocephalic/atraumatic EYES: EOMI ENMT: Mucous  membranes moist NEURO: Pt is awake/alert, moves all extremitiesx4 EXTREMITIES:full ROM, full range of motion of left thumb.  She has diffuse tenderness, erythema and puncture wounds are noted. SKIN: warm, color normal tenderness and erythema of the left thumb, puncture was noted, see photo      Patient gave verbal permission to utilize photo for medical documentation only The image was not stored on any personal device ED Results / Procedures / Treatments   Labs (all labs ordered are listed, but only abnormal results are displayed) Labs Reviewed - No data to display  EKG None  Radiology DG Finger Thumb Left  Result Date: 12/18/2021 CLINICAL DATA:  Cat bite, left thumb pain EXAM: LEFT THUMB 2+V COMPARISON:  None Available. FINDINGS: There is no evidence of fracture or dislocation. There is no evidence of arthropathy or other focal bone abnormality. Mild diffuse soft tissue swelling. No retained radiopaque foreign body. IMPRESSION: Soft tissue swelling.  No fracture or dislocation. Electronically Signed   By: Fidela Salisbury M.D.   On: 12/18/2021 03:03    Procedures Procedures    Medications Ordered in ED Medications  Tdap (BOOSTRIX) injection 0.5 mL (0.5 mLs Intramuscular Given 12/18/21 0233)  amoxicillin-clavulanate (AUGMENTIN) 875-125 MG per tablet 1 tablet (1 tablet Oral Given 12/18/21 0232)  ibuprofen (ADVIL) tablet 400 mg (400 mg Oral Given 12/18/21 0232)  ED Course/ Medical Decision Making/ A&P                           Medical Decision Making Amount and/or Complexity of Data Reviewed Radiology: ordered.  Risk Prescription drug management.   Patient presents after cat bite to her left thumb.  This cat has not been old enough to receive rabies vaccinations, but lives indoors and is very low risk for rabies exposure.  The cat is in possession of the family  Patient has pain and swelling.  She is already developing signs of cellulitis.  However she has full flexion  extension of the thumb and no signs of tenosynovitis. I personally reviewed the x-ray and it is negative for acute fracture or foreign body  Plan will be to treat at home with warm soaks, oral antibiotics, and monitor the redness pain and swelling. She was advised that she is very high risk for worsening infection and may need to be admitted. However she is well-appearing, and I feel it is appropriate for trial of outpatient oral antibiotic I advised her that if there is no improvement over the next 24 hours or any worsening she will need to return to the emergency department        Final Clinical Impression(s) / ED Diagnoses Final diagnoses:  Animal bite  Cat bite, initial encounter    Rx / DC Orders ED Discharge Orders          Ordered    amoxicillin-clavulanate (AUGMENTIN) 875-125 MG tablet  Every 12 hours        12/18/21 0315              Ripley Fraise, MD 12/18/21 [phone]

## 2021-12-18 NOTE — ED Triage Notes (Signed)
Pt c/o cat bite to left thumb at 1930. Pt's thumb is swollen and red. Pt states cat is not UTD on rabies.

## 2021-12-18 NOTE — ED Notes (Signed)
Pt verbalizes understanding of discharge instructions. Opportunity for questioning and answers were provided. Pt discharged from ED to home.   ? ?

## 2023-12-06 DIAGNOSIS — Z111 Encounter for screening for respiratory tuberculosis: Secondary | ICD-10-CM | POA: Diagnosis not present

## 2023-12-27 IMAGING — DX DG FINGER THUMB 2+V*L*
3 series · 3 of 3 positions shown · non-contrast
Comparison: None Available.

CLINICAL DATA: Cat bite, left thumb pain

EXAM:
LEFT THUMB 2+V

[finger ap]
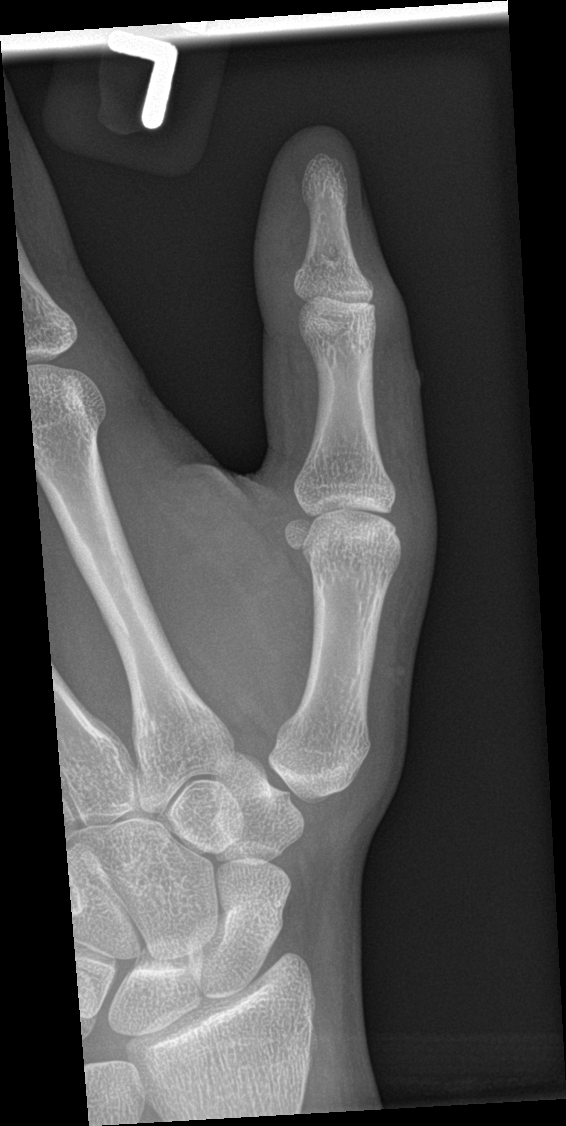

[finger obl]
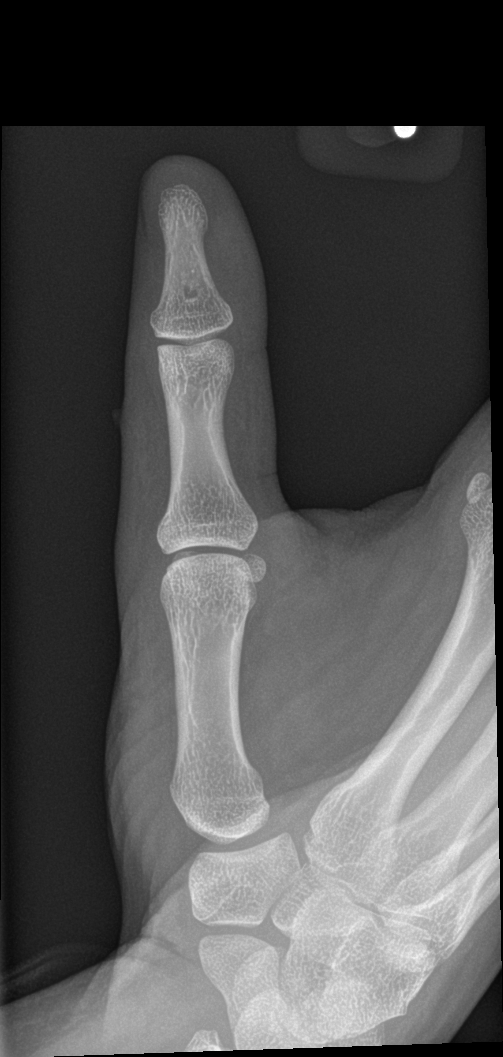

[finger lat]
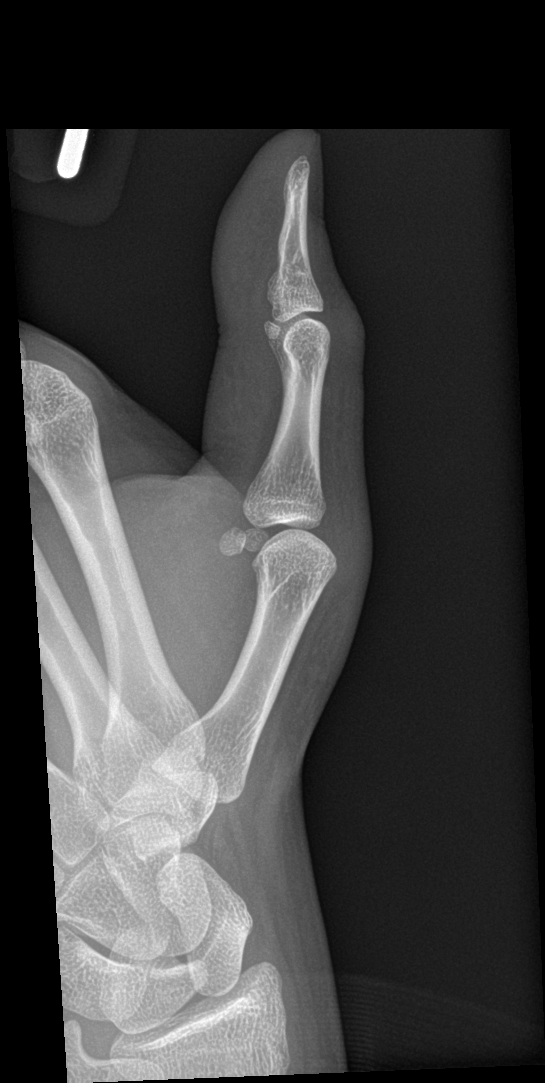

[3 of 3 positions shown; findings below may reference images not displayed]

FINDINGS: There is no evidence of fracture or dislocation. There is no
evidence of arthropathy or other focal bone abnormality. Mild
diffuse soft tissue swelling. No retained radiopaque foreign body.
IMPRESSION: Soft tissue swelling.  No fracture or dislocation.

## 2024-02-17 ENCOUNTER — Other Ambulatory Visit: Payer: Self-pay

## 2024-02-17 ENCOUNTER — Encounter (HOSPITAL_BASED_OUTPATIENT_CLINIC_OR_DEPARTMENT_OTHER): Payer: Self-pay

## 2024-02-17 DIAGNOSIS — I951 Orthostatic hypotension: Secondary | ICD-10-CM | POA: Diagnosis not present

## 2024-02-17 DIAGNOSIS — R42 Dizziness and giddiness: Secondary | ICD-10-CM | POA: Diagnosis not present

## 2024-02-17 LAB — COMPREHENSIVE METABOLIC PANEL WITH GFR
ALT: 14 U/L (ref 0–44)
AST: 18 U/L (ref 15–41)
Albumin: 4.8 g/dL (ref 3.5–5.0)
Alkaline Phosphatase: 65 U/L (ref 38–126)
Anion gap: 13 (ref 5–15)
BUN: 10 mg/dL (ref 6–20)
CO2: 22 mmol/L (ref 22–32)
Calcium: 9.5 mg/dL (ref 8.9–10.3)
Chloride: 101 mmol/L (ref 98–111)
Creatinine, Ser: 0.63 mg/dL (ref 0.44–1.00)
GFR, Estimated: >60 mL/min (ref >60)
Glucose, Bld: 104 mg/dL — ABNORMAL HIGH (ref 70–99)
Potassium: 4.1 mmol/L (ref 3.5–5.1)
Sodium: 137 mmol/L (ref 135–145)
Total Bilirubin: 0.3 mg/dL (ref 0.0–1.2)
Total Protein: 7.7 g/dL (ref 6.5–8.1)

## 2024-02-17 LAB — URINALYSIS, ROUTINE W REFLEX MICROSCOPIC
Bilirubin Urine: NEGATIVE
Glucose, UA: NEGATIVE mg/dL
Hgb urine dipstick: NEGATIVE
Ketones, ur: NEGATIVE mg/dL
Leukocytes,Ua: NEGATIVE
Nitrite: NEGATIVE
Protein, ur: NEGATIVE mg/dL
Specific Gravity, Urine: 1.02 (ref 1.005–1.030)
pH: 5.5 (ref 5.0–8.0)

## 2024-02-17 LAB — CBG MONITORING, ED: Glucose-Capillary: 92 mg/dL (ref 70–99)

## 2024-02-17 LAB — CBC WITH DIFFERENTIAL/PLATELET
Abs Immature Granulocytes: 0.03 K/uL (ref 0.00–0.07)
Basophils Absolute: 0.1 K/uL (ref 0.0–0.1)
Basophils Relative: 1 %
Eosinophils Absolute: 0.3 K/uL (ref 0.0–0.5)
Eosinophils Relative: 3 %
HCT: 41 % (ref 36.0–46.0)
Hemoglobin: 13.7 g/dL (ref 12.0–15.0)
Immature Granulocytes: 0 %
Lymphocytes Relative: 25 %
Lymphs Abs: 2.3 K/uL (ref 0.7–4.0)
MCH: 28.9 pg (ref 26.0–34.0)
MCHC: 33.4 g/dL (ref 30.0–36.0)
MCV: 86.5 fL (ref 80.0–100.0)
Monocytes Absolute: 0.6 K/uL (ref 0.1–1.0)
Monocytes Relative: 6 %
Neutro Abs: 6.2 K/uL (ref 1.7–7.7)
Neutrophils Relative %: 65 %
Platelets: 368 K/uL (ref 150–400)
RBC: 4.74 MIL/uL (ref 3.87–5.11)
RDW: 13.7 % (ref 11.5–15.5)
WBC: 9.6 K/uL (ref 4.0–10.5)
nRBC: 0 % (ref 0.0–0.2)

## 2024-02-17 LAB — PREGNANCY, URINE: Preg Test, Ur: NEGATIVE

## 2024-02-17 NOTE — ED Triage Notes (Addendum)
 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  Approx 1945 At the time her BP was 92/64 and HR 100 and sats were 88%.   Pt states she still feels weak, when standing dizzy CBG 92 in triage

## 2024-02-18 ENCOUNTER — Emergency Department (HOSPITAL_BASED_OUTPATIENT_CLINIC_OR_DEPARTMENT_OTHER)
Admission: EM | Admit: 2024-02-18 | Discharge: 2024-02-18 | Disposition: A | Discharge status: Home / Self Care | Attending: Emergency Medicine | Admitting: Emergency Medicine

## 2024-02-18 DIAGNOSIS — R42 Dizziness and giddiness: Secondary | ICD-10-CM

## 2024-02-18 MED ORDER — SODIUM CHLORIDE 0.9 % IV BOLUS
500.0000 mL | Freq: Once | INTRAVENOUS | Status: AC
  Administered 2024-02-18: 500 mL via INTRAVENOUS

## 2024-02-18 NOTE — ED Provider Notes (Signed)
 [facility] Provider Note   CSN: [id number] Arrival date & time: 02/17/24  2147     Patient presents with: Near Syncope   Diana Mcclure is a 23 y.o. female.   The history is provided by the patient.  Near Syncope This is a new problem. The problem occurs constantly. The problem has been resolved. Pertinent negatives include no chest pain, no abdominal pain, no headaches and no shortness of breath. Nothing aggravates the symptoms. Nothing relieves the symptoms. She has tried nothing for the symptoms. The treatment provided no relief.  Patient had been standing for a long period of time and had had approximately 40 ounces of water today and yesterday same and felt as if she was going to pass out.  No travel, no leg pain.  No CP no SOB     Prior to Admission medications   Medication Sig Start Date End Date Taking? Authorizing Provider  amoxicillin -clavulanate (AUGMENTIN ) 875-125 MG tablet Take 1 tablet by mouth every 12 (twelve) hours. 12/18/21   Midge Golas, MD  albuterol  (VENTOLIN  HFA) 108 (90 Base) MCG/ACT inhaler Inhale 1-2 puffs into the lungs every 6 (six) hours as needed for wheezing or shortness of breath. Patient not taking: Reported on 12/07/2020 05/07/20 12/07/20  Stuart Vernell Norris, PA-C    Allergies: Patient has no known allergies.    Review of Systems  Constitutional:  Negative for fever.  Respiratory:  Negative for shortness of breath, wheezing and stridor.   Cardiovascular:  Positive for near-syncope. Negative for chest pain, palpitations and leg swelling.  Gastrointestinal:  Negative for abdominal pain.  Neurological:  Positive for light-headedness. Negative for headaches.  All other systems reviewed and are negative.   Updated Vital Signs BP 119/82 (BP Location: Left Arm)   Pulse 92   Temp 97.9 F (36.6 C)   Resp 18   Ht 5' 2 (1.575 m)   Wt 65.8 kg   LMP 01/21/2024 (Exact Date)   SpO2 100%   BMI  26.52 kg/m   Physical Exam Vitals and nursing note reviewed.  Constitutional:      General: She is not in acute distress.    Appearance: Normal appearance. She is well-developed.  HENT:     Head: Normocephalic and atraumatic.     Nose: Nose normal.  Eyes:     Pupils: Pupils are equal, round, and reactive to light.  Cardiovascular:     Rate and Rhythm: Normal rate and regular rhythm.     Pulses: Normal pulses.     Heart sounds: Normal heart sounds.  Pulmonary:     Effort: Pulmonary effort is normal. No respiratory distress.     Breath sounds: Normal breath sounds.  Abdominal:     General: Abdomen is flat. Bowel sounds are normal. There is no distension.     Palpations: Abdomen is soft.     Tenderness: There is no abdominal tenderness. There is no guarding or rebound.  Musculoskeletal:        General: No tenderness. Normal range of motion.     Cervical back: Neck supple.     Right lower leg: No edema.     Left lower leg: No edema.  Skin:    General: Skin is dry.     Capillary Refill: Capillary refill takes less than 2 seconds.     Findings: No erythema or rash.  Neurological:     General: No focal deficit present.     Mental Status:  She is alert and oriented to person, place, and time.     Deep Tendon Reflexes: Reflexes normal.  Psychiatric:        Mood and Affect: Mood normal.     (all labs ordered are listed, but only abnormal results are displayed) Results for orders placed or performed during the hospital encounter of 02/18/24  CBG monitoring, ED   Collection Time: 02/17/24 10:00 PM  Result Value Ref Range   Glucose-Capillary 92 70 - 99 mg/dL  Comprehensive metabolic panel   Collection Time: 02/17/24 10:09 PM  Result Value Ref Range   Sodium 137 135 - 145 mmol/L   Potassium 4.1 3.5 - 5.1 mmol/L   Chloride 101 98 - 111 mmol/L   CO2 22 22 - 32 mmol/L   Glucose, Bld 104 (H) 70 - 99 mg/dL   BUN 10 6 - 20 mg/dL   Creatinine, Ser 9.36 0.44 - 1.00 mg/dL   Calcium  9.5 8.9 - 89.6 mg/dL   Total Protein 7.7 6.5 - 8.1 g/dL   Albumin 4.8 3.5 - 5.0 g/dL   AST 18 15 - 41 U/L   ALT 14 0 - 44 U/L   Alkaline Phosphatase 65 38 - 126 U/L   Total Bilirubin 0.3 0.0 - 1.2 mg/dL   GFR, Estimated >39 >39 mL/min   Anion gap 13 5 - 15  CBC with Differential   Collection Time: 02/17/24 10:09 PM  Result Value Ref Range   WBC 9.6 4.0 - 10.5 K/uL   RBC 4.74 3.87 - 5.11 MIL/uL   Hemoglobin 13.7 12.0 - 15.0 g/dL   HCT 58.9 63.9 - 53.9 %   MCV 86.5 80.0 - 100.0 fL   MCH 28.9 26.0 - 34.0 pg   MCHC 33.4 30.0 - 36.0 g/dL   RDW 86.2 88.4 - 84.4 %   Platelets 368 150 - 400 K/uL   nRBC 0.0 0.0 - 0.2 %   Neutrophils Relative % 65 %   Neutro Abs 6.2 1.7 - 7.7 K/uL   Lymphocytes Relative 25 %   Lymphs Abs 2.3 0.7 - 4.0 K/uL   Monocytes Relative 6 %   Monocytes Absolute 0.6 0.1 - 1.0 K/uL   Eosinophils Relative 3 %   Eosinophils Absolute 0.3 0.0 - 0.5 K/uL   Basophils Relative 1 %   Basophils Absolute 0.1 0.0 - 0.1 K/uL   Immature Granulocytes 0 %   Abs Immature Granulocytes 0.03 0.00 - 0.07 K/uL  Pregnancy, urine   Collection Time: 02/17/24 11:18 PM  Result Value Ref Range   Preg Test, Ur NEGATIVE NEGATIVE  Urinalysis, Routine w reflex microscopic -Urine, Clean Catch   Collection Time: 02/17/24 11:18 PM  Result Value Ref Range   Color, Urine YELLOW YELLOW   APPearance CLEAR CLEAR   Specific Gravity, Urine 1.020 1.005 - 1.030   pH 5.5 5.0 - 8.0   Glucose, UA NEGATIVE NEGATIVE mg/dL   Hgb urine dipstick NEGATIVE NEGATIVE   Bilirubin Urine NEGATIVE NEGATIVE   Ketones, ur NEGATIVE NEGATIVE mg/dL   Protein, ur NEGATIVE NEGATIVE mg/dL   Nitrite NEGATIVE NEGATIVE   Leukocytes,Ua NEGATIVE NEGATIVE   No results found.  EKG:  EKG Interpretation Date/Time:  Tuesday February 17 2024 22:04:29 EDT Ventricular Rate:  86 PR Interval:  137 QRS Duration:  82 QT Interval:  338 QTC Calculation: 405 R Axis:   66  Text Interpretation: Sinus rhythm Confirmed by Nettie,  Mariadelcarmen Corella ([id number]) on 02/18/2024 2:08:15 AM  Radiology: No results found.   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    Details: Previous notes reviewed  Labs: ordered.    Details: Urine is negative for UTI.  Pregnancy is negative.  Normal white count 9.6, normal hemoglobin 13.7.  normal sodium 137, normal potassium normal creatinine  ECG/medicine tests: ordered and independent interpretation performed. Decision-making details documented in ED Course.  Risk Risk Details: Patient was orthostatic based on vitals.  Hydrated in the ED.  her oxygen is normal here.  I suspect O2 sat there was either poor wave form or on the blood pressure cuff arm as she is 100% here.  I have given fluids and advised drinking 100 or more ounces a day.  Stable for discharge.  Strict returns.     Orthostatic VS for the past 72 hrs (Last 3 readings):  BP Location  02/17/24 2158 Left Arm      Final diagnoses:  None   No signs of systemic illness or infection. The patient is nontoxic-appearing on exam and vital signs are within normal limits.  I have reviewed the triage vital signs and the nursing notes. Pertinent labs & imaging results that were available during my care of the patient were reviewed by me and considered in my medical decision making (see chart for details). After history, exam, and medical workup I feel the patient has been appropriately medically screened and is safe for discharge home. Pertinent diagnoses were discussed with the patient. Patient was given return precautions.      ED Discharge Orders     None          Jaylin Benzel, MD 02/18/24 [id number]

## 2024-04-21 DIAGNOSIS — Z23 Encounter for immunization: Secondary | ICD-10-CM | POA: Diagnosis not present

## 2024-04-21 DIAGNOSIS — R55 Syncope and collapse: Secondary | ICD-10-CM | POA: Diagnosis not present

## 2024-04-21 DIAGNOSIS — Z7689 Persons encountering health services in other specified circumstances: Secondary | ICD-10-CM | POA: Diagnosis not present

## 2024-05-06 DIAGNOSIS — I491 Atrial premature depolarization: Secondary | ICD-10-CM | POA: Diagnosis not present

## 2024-05-06 DIAGNOSIS — R000 Tachycardia, unspecified: Secondary | ICD-10-CM | POA: Diagnosis not present

## 2024-05-06 DIAGNOSIS — I071 Rheumatic tricuspid insufficiency: Secondary | ICD-10-CM | POA: Diagnosis not present

## 2024-05-06 DIAGNOSIS — I493 Ventricular premature depolarization: Secondary | ICD-10-CM | POA: Diagnosis not present

## 2024-05-28 DIAGNOSIS — I491 Atrial premature depolarization: Secondary | ICD-10-CM | POA: Diagnosis not present

## 2024-05-28 DIAGNOSIS — I493 Ventricular premature depolarization: Secondary | ICD-10-CM | POA: Diagnosis not present

## 2024-05-28 DIAGNOSIS — R000 Tachycardia, unspecified: Secondary | ICD-10-CM | POA: Diagnosis not present
